# Patient Record
Sex: Female | Born: 1990 | Race: Black or African American | Hispanic: No | Marital: Single | Smoking: Never smoker
Health system: Southern US, Community
[De-identification: ages and names within clinical notes are randomized; demographics above are authoritative.]

---
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
## 2013-05-01 ENCOUNTER — Ambulatory Visit (INDEPENDENT_AMBULATORY_CARE_PROVIDER_SITE_OTHER): Payer: PRIVATE HEALTH INSURANCE

## 2013-05-01 ENCOUNTER — Encounter (INDEPENDENT_AMBULATORY_CARE_PROVIDER_SITE_OTHER): Payer: Self-pay

## 2013-05-01 ENCOUNTER — Ambulatory Visit (INDEPENDENT_AMBULATORY_CARE_PROVIDER_SITE_OTHER): Payer: PRIVATE HEALTH INSURANCE | Admitting: Internal Medicine

## 2013-05-01 VITALS — BP 106/71 | HR 63 | Temp 98.5°F | Resp 18 | Ht 62.0 in | Wt 140.0 lb

## 2013-05-01 DIAGNOSIS — S6990XA Unspecified injury of unspecified wrist, hand and finger(s), initial encounter: Secondary | ICD-10-CM

## 2013-05-01 DIAGNOSIS — S6991XA Unspecified injury of right wrist, hand and finger(s), initial encounter: Secondary | ICD-10-CM

## 2013-05-01 NOTE — Patient Instructions (Signed)
Contusion: Hand  You have a CONTUSION of your hand. This causes local pain, swelling and sometimes bruising. There are no broken bones. This injury takes from a few days to a few weeks to heal.  Home Care:  1) Keep your arm elevated to reduce pain and swelling. This is very important during the first 48 hours.  2) Apply an ice pack (ice cubes in a plastic bag, wrapped in a towel) over the injured area for 20 minutes every 1-2 hours the first day. You should continue with ice packs 3-4 times a day for the next two days. Continue the use of ice packs for relief of pain and swelling as needed.  3) You may use acetaminophen (Tylenol) or ibuprofen (Motrin, Advil) to control pain, unless another pain medicine was prescribed. [ NOTE : If you have chronic liver or kidney disease or ever had a stomach ulcer or GI bleeding, talk with your doctor before using these medicines.]  Follow Up  with your doctor or this facility if you are not starting to improve within the next THREE days.  [NOTE: If X-rays were taken, they will be reviewed by a radiologist. You will be notified of any new findings that may affect your care.]  Get Prompt Medical Attention  if any of the following occur:  -- Pain or swelling increases  -- Redness, warmth or drainage  -- Hand or fingers becomes cold, blue, numb or tingly   2000-2014 [employer], [address], [place] [address]. All rights reserved. This information is not intended as a substitute for professional medical care. Always follow your healthcare professional's instructions.

## 2013-05-01 NOTE — Progress Notes (Signed)
Subjective:       Patient ID: Heather Gates is a 23 y.o. female.    HPI    Chief Complaint   Patient presents with   . Hand Injury     Right hand injury on 05/01/13     Was using a dolly today to move a box weighing over 1000 pounds, the strap popped and the box moved, trapping her right hand between the box and the dolly.  She now has pain and a bump that is tender on the dorsum of her hand in the 1st MC region.  No weakness or numbness.  Able to fully flex and extend thumb and other fingers, has pain in 1st [facility] with full thumb extension.  No other injury.      No past medical history on file.  No current outpatient prescriptions on file.     No Known Allergies  History     Social History   . Marital Status: Single     Spouse Name: N/A     Number of Children: N/A   . Years of Education: N/A     Occupational History   . Not on file.     Social History Main Topics   . Smoking status: Never Smoker    . Smokeless tobacco: Not on file   . Alcohol Use: No   . Drug Use: Not on file   . Sexually Active: Not on file     Other Topics Concern   . Not on file     Social History Narrative   . No narrative on file         Review of Systems  See HPI, negative otherwise      Objective:     Physical Exam   Constitutional: She is oriented to person, place, and time. No distress.   Eyes: Conjunctivae normal are normal. No scleral icterus.   Pulmonary/Chest: Effort normal.   Musculoskeletal:        Hands:       Right hand:  Neurovascular and tendon function intact.  See diagram for area of tenderness.     Neurological: She is alert and oriented to person, place, and time. No cranial nerve deficit.   Skin: Skin is warm and dry. She is not diaphoretic.   Psychiatric: She has a normal mood and affect. Her speech is normal and behavior is normal.     BP 106/71  Pulse 63  Temp 98.5 F (36.9 C) (Oral)  Resp 18  Ht 1.575 m (5\' 2" )  Wt 63.504 kg (140 lb)  BMI 25.60 kg/m2  LMP 04/04/2013   Xray right hand:  No fx in area of tenderness,  distal 1st MC with question of small fx only seen on oblique view, other views normal      Assessment:       1. Hand injury, right, initial encounter  X-ray Finger Right PA, Oblique and Lateral   contusion        Plan:       Reviewed AVS in detail with patient.  Return in 4-5 days for follow up, no use of right hand.

## 2018-01-09 IMAGING — CR DG FOOT COMPLETE 3+V*R*
3 series · 3 of 3 positions shown · non-contrast
Comparison: None.

CLINICAL DATA: Gradually worsening right foot pain and swelling
over the last month. Worsening pain with ambulation. No acute
injury.

EXAM:
RIGHT FOOT COMPLETE - 3+ VIEW

[foot ap]
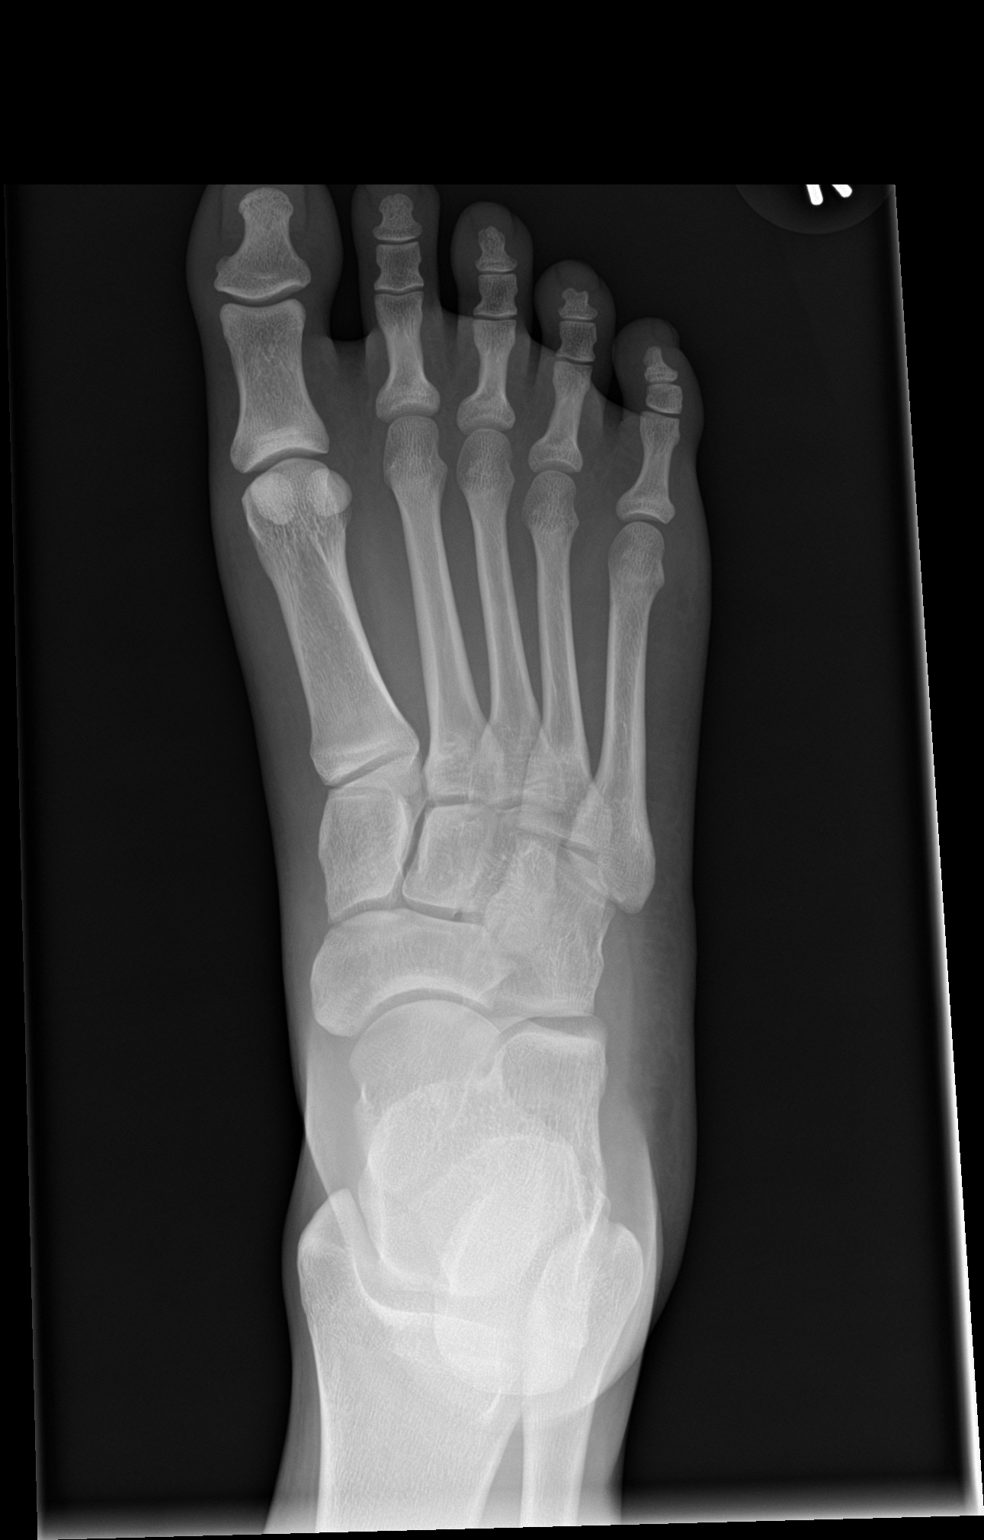

[foot obl]
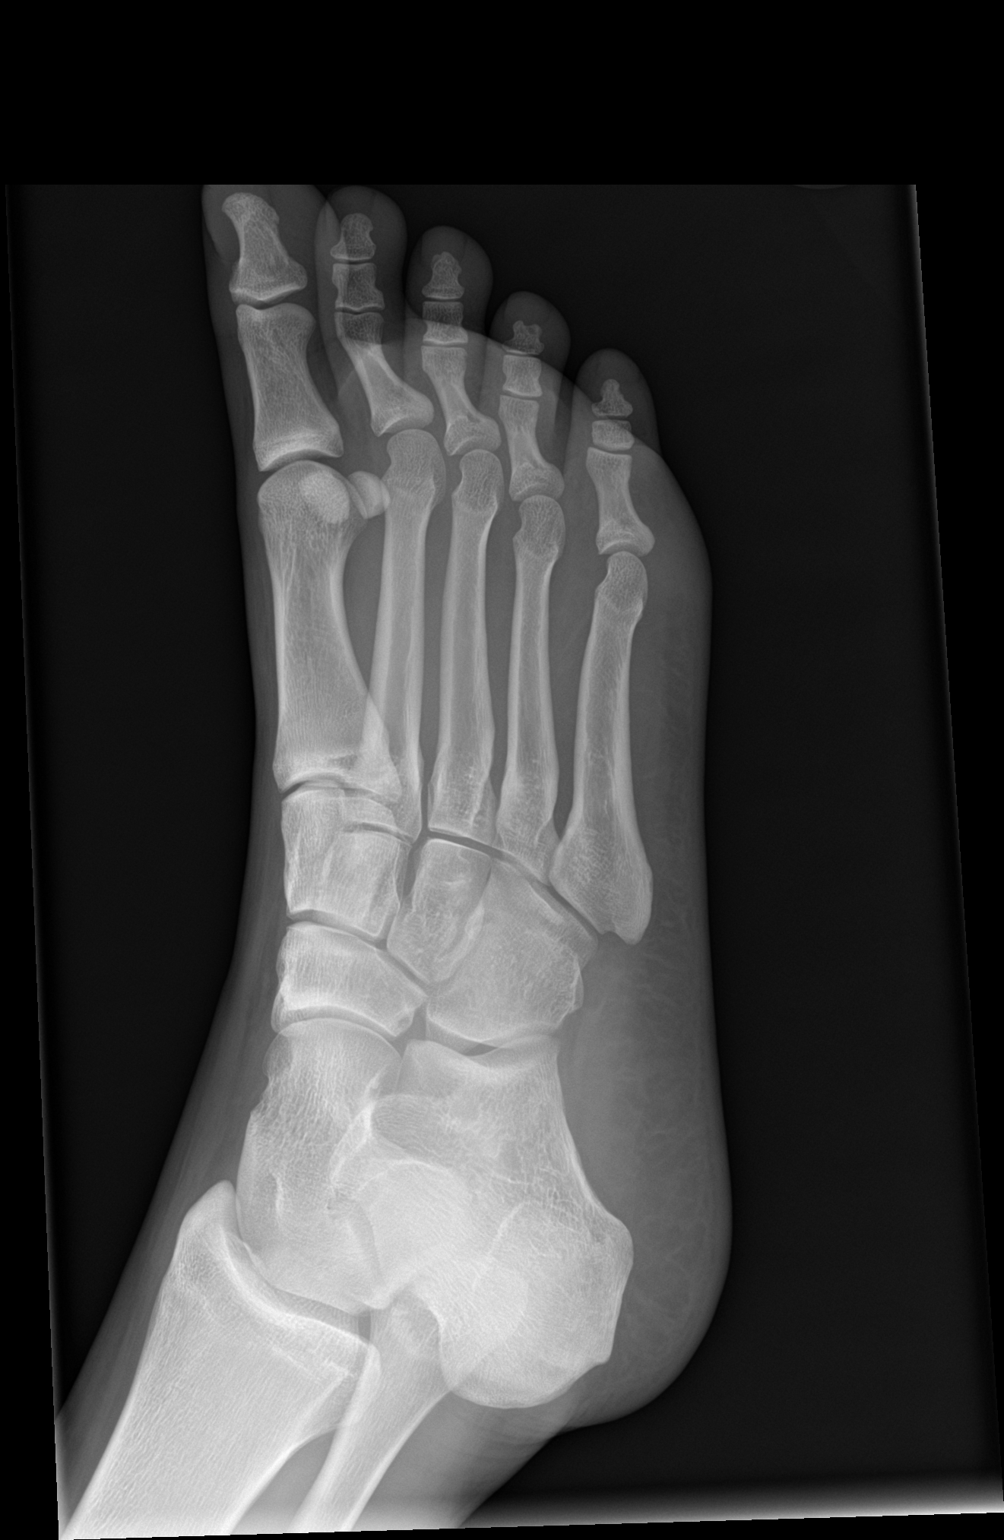

[foot lat]
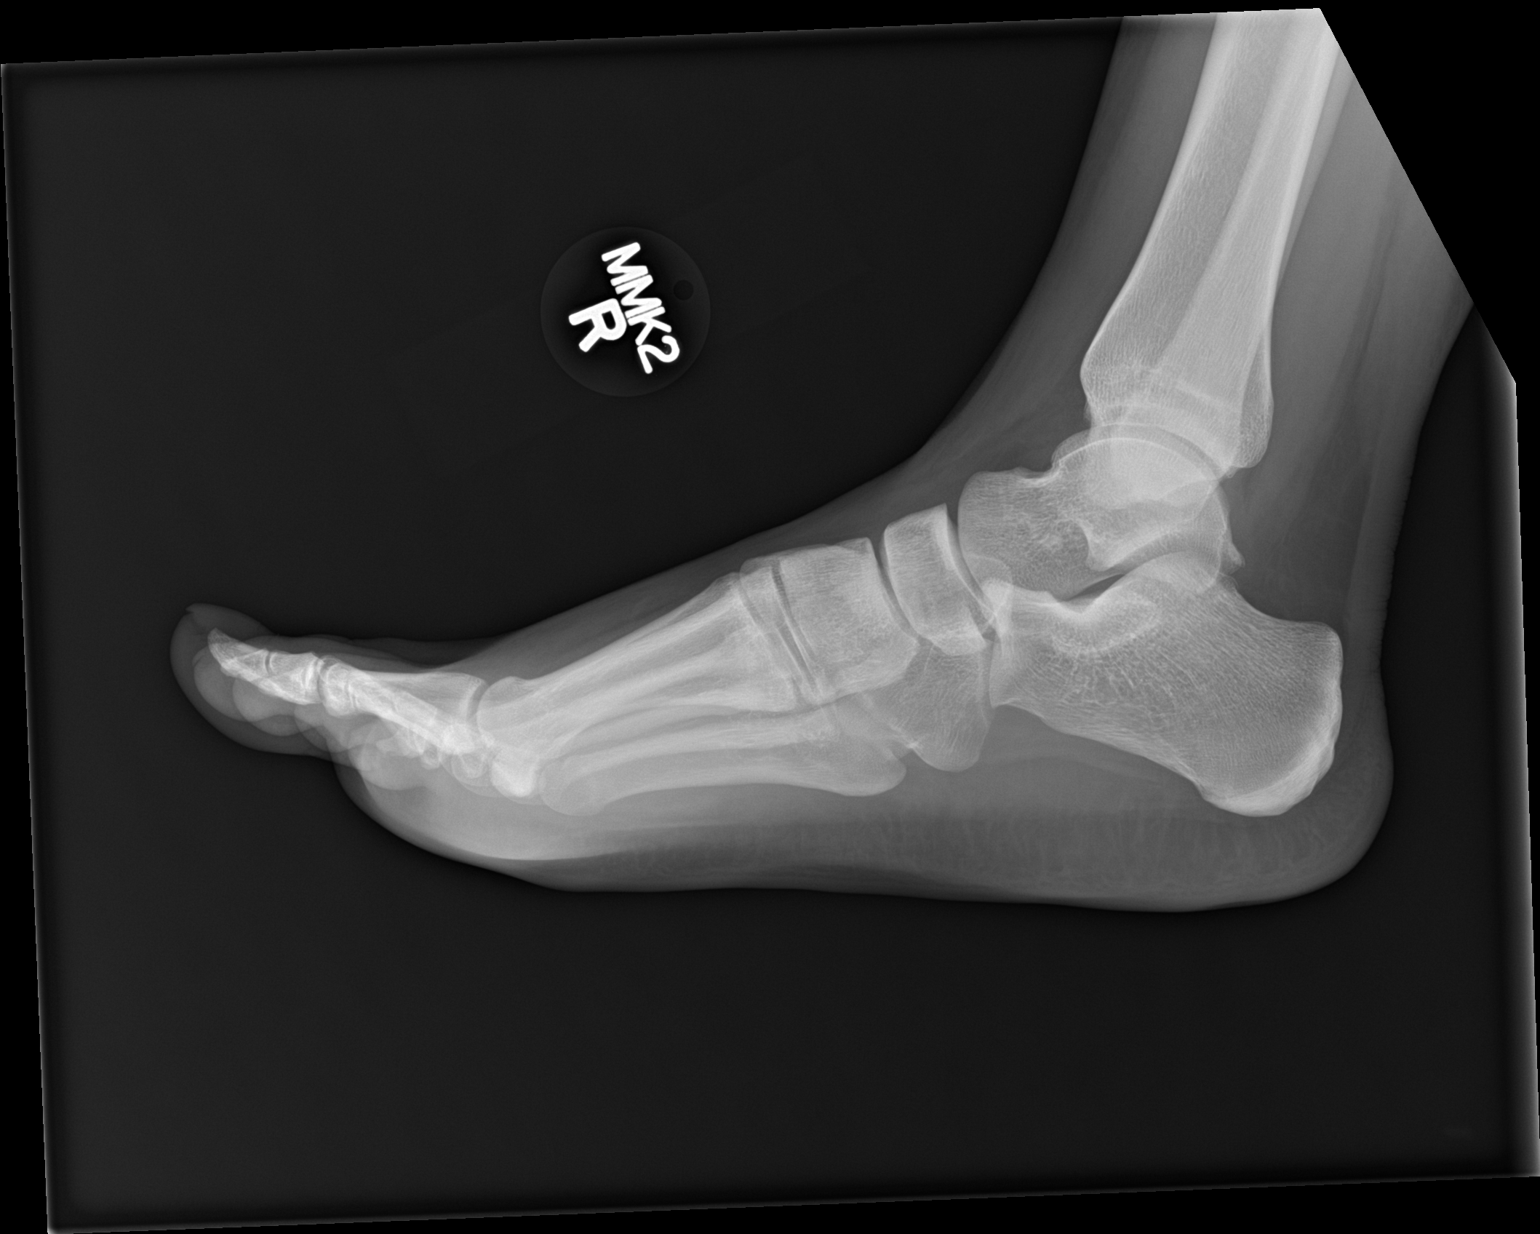

[3 of 3 positions shown; findings below may reference images not displayed]

FINDINGS: The mineralization and alignment are normal. There is no evidence of
acute fracture or dislocation. The joint spaces are maintained. No
focal soft tissue swelling is evident.
IMPRESSION: No acute osseous findings.  No evident stress fracture.

## 2021-05-22 IMAGING — CR DG SHOULDER 2+V*R*
2 series · 2 of 2 positions shown · non-contrast
Comparison: None.

CLINICAL DATA: Right shoulder pain.  No known injury.

EXAM:
RIGHT SHOULDER - 2+ VIEW

[w shoulder external right]
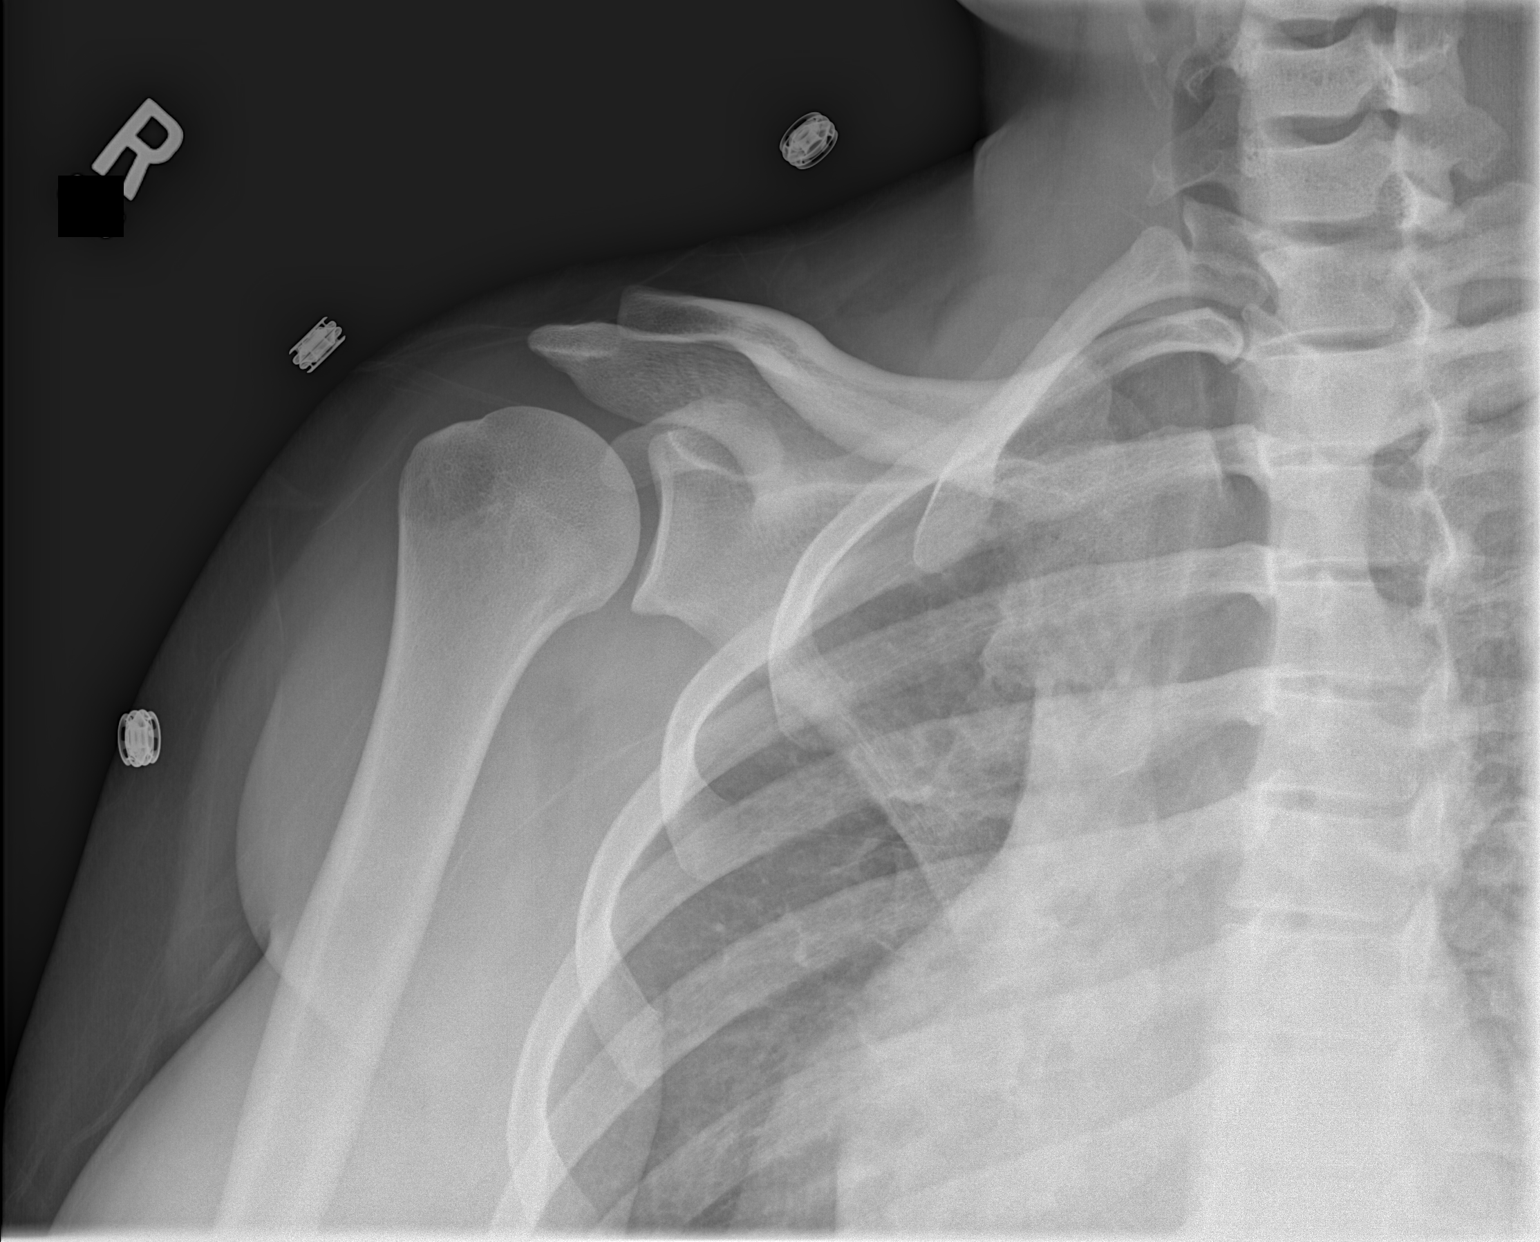

[w shoulder y-view right]
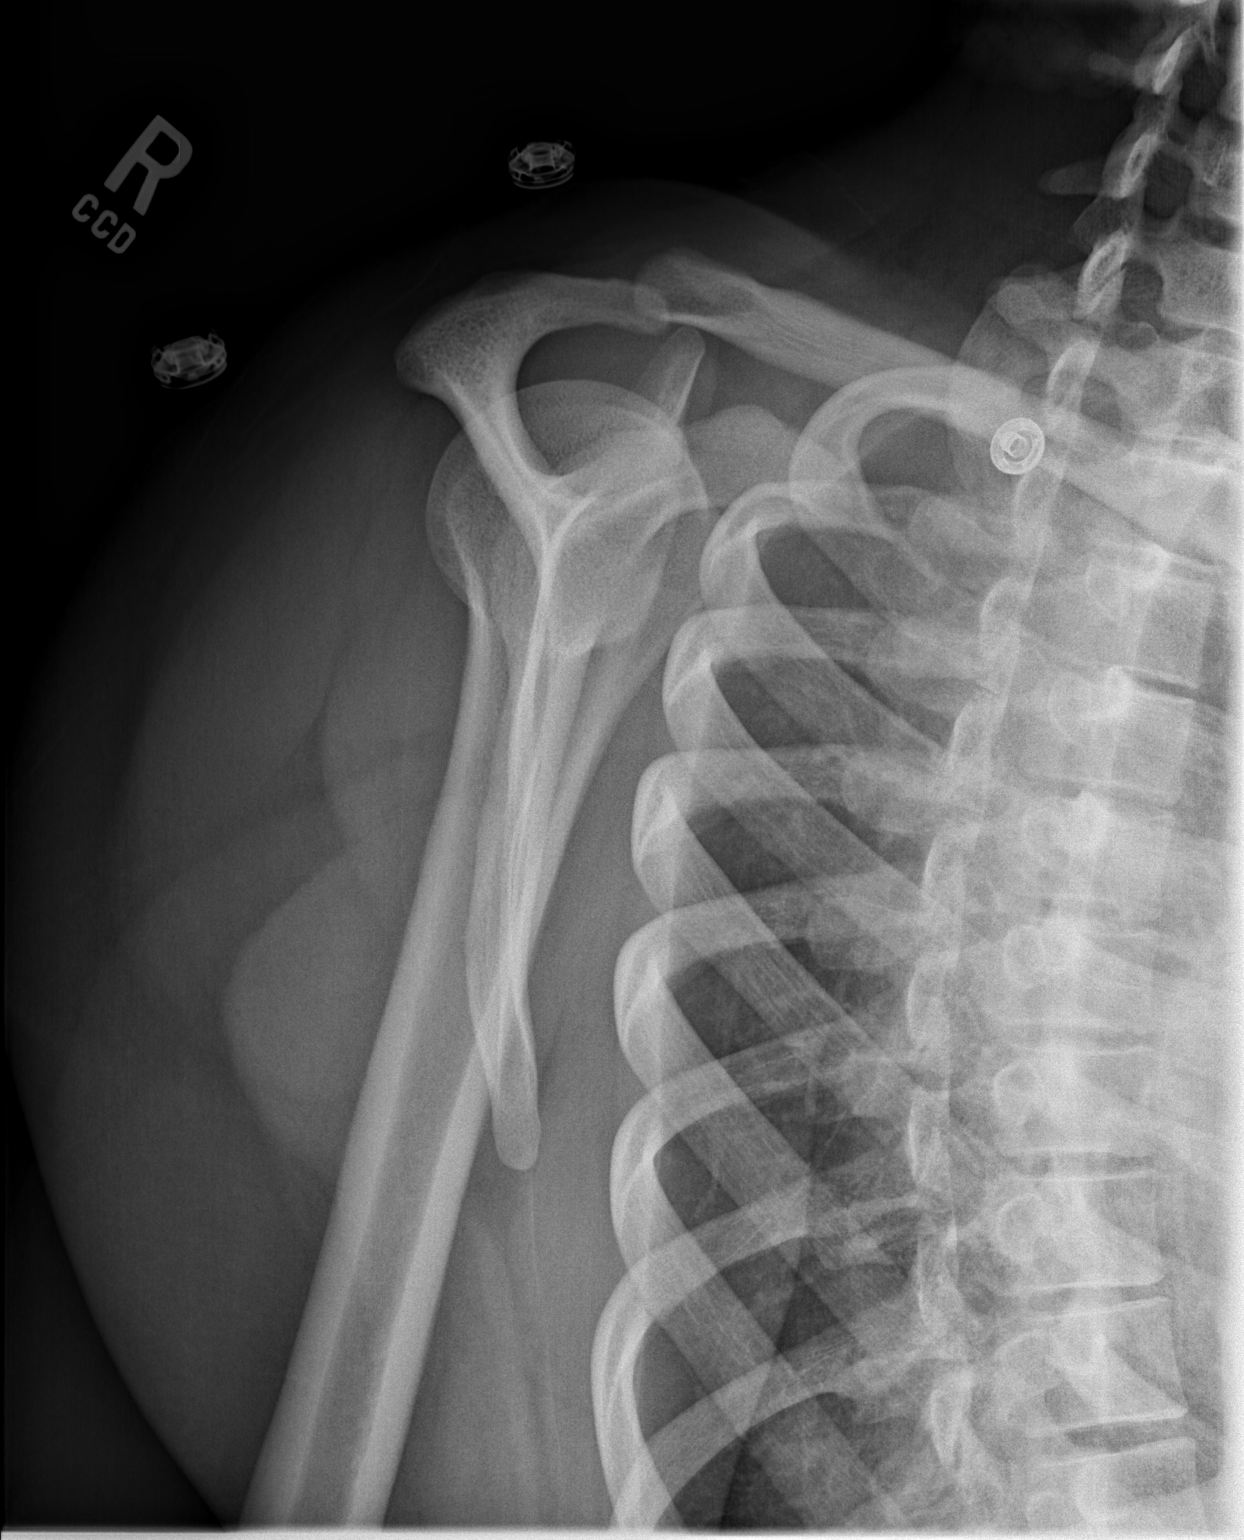

[2 of 2 positions shown; findings below may reference images not displayed]

FINDINGS: No fracture or dislocation. Glenohumeral and acromioclavicular joint
spaces appear preserved. No evidence of calcific tendinitis. Limited
visualization of the adjacent thorax is normal given obliquity and
technique.
IMPRESSION: No explanation for patient's right shoulder pain.
